# Patient Record
Sex: Male | Born: 1990 | Race: Black or African American | Hispanic: No | Marital: Single | Smoking: Current every day smoker
Health system: Southern US, Community
[De-identification: ages and names within clinical notes are randomized; demographics above are authoritative.]

---

## 2016-03-17 ENCOUNTER — Encounter (HOSPITAL_COMMUNITY): Payer: Self-pay

## 2016-03-17 ENCOUNTER — Emergency Department (HOSPITAL_COMMUNITY)
Admission: EM | Admit: 2016-03-17 | Discharge: 2016-03-17 | Disposition: A | Discharge status: Home / Self Care | Payer: Self-pay | Attending: Emergency Medicine | Admitting: Emergency Medicine

## 2016-03-17 DIAGNOSIS — F17200 Nicotine dependence, unspecified, uncomplicated: Secondary | ICD-10-CM | POA: Insufficient documentation

## 2016-03-17 DIAGNOSIS — Z711 Person with feared health complaint in whom no diagnosis is made: Secondary | ICD-10-CM

## 2016-03-17 DIAGNOSIS — R369 Urethral discharge, unspecified: Secondary | ICD-10-CM

## 2016-03-17 LAB — URINALYSIS, ROUTINE W REFLEX MICROSCOPIC
Bilirubin Urine: NEGATIVE
Glucose, UA: NEGATIVE mg/dL
Hgb urine dipstick: NEGATIVE
KETONES UR: NEGATIVE mg/dL
LEUKOCYTES UA: NEGATIVE
NITRITE: NEGATIVE
PH: 6 (ref 5.0–8.0)
PROTEIN: NEGATIVE mg/dL
Specific Gravity, Urine: 1.015 (ref 1.005–1.030)

## 2016-03-17 MED ORDER — CEFTRIAXONE SODIUM 250 MG IJ SOLR
250.0000 mg | Freq: Once | INTRAMUSCULAR | Status: AC
  Administered 2016-03-17: 250 mg via INTRAMUSCULAR
  Filled 2016-03-17: qty 250

## 2016-03-17 MED ORDER — LIDOCAINE HCL (PF) 1 % IJ SOLN
INTRAMUSCULAR | Status: AC
  Filled 2016-03-17: qty 5

## 2016-03-17 MED ORDER — AZITHROMYCIN 250 MG PO TABS
1000.0000 mg | ORAL_TABLET | Freq: Once | ORAL | Status: AC
  Administered 2016-03-17: 1000 mg via ORAL
  Filled 2016-03-17: qty 4

## 2016-03-17 NOTE — ED Provider Notes (Signed)
CSN: [id number]     Arrival date & time 03/17/16  1434 History   First MD Initiated Contact with Patient 03/17/16 1545     Chief Complaint  Patient presents with  . S74.5    Eddie Perez is a 25 y.o. male who presents to the ED Complaining of penile discharge for the past 2 weeks. Patient reports he is sexually active and has not been using protection. He reports a history of chlamydia. No known STD contacts recently. Patient denies fevers, abdominal pain, nausea, vomiting, diarrhea, dysuria, hematuria, testicular pain, penile pain, GU rashes, or mouth sores.   The history is provided by the patient. No language interpreter was used.    History reviewed. No pertinent past medical history. History reviewed. No pertinent past surgical history. No family history on file. Social History  Substance Use Topics  . Smoking status: Current Every Day Smoker  . Smokeless tobacco: None  . Alcohol Use: Yes     Comment: occasionally    Review of Systems  Constitutional: Negative for fever and chills.  Gastrointestinal: Negative for nausea, vomiting and abdominal pain.  Genitourinary: Positive for discharge. Negative for dysuria, urgency, frequency, hematuria, decreased urine volume, penile swelling, scrotal swelling, difficulty urinating, genital sores, penile pain and testicular pain.  Musculoskeletal: Negative for myalgias.  Skin: Negative for rash.      Allergies  Review of patient's allergies indicates no known allergies.  Home Medications   Prior to Admission medications   Not on File   BP 122/76 mmHg  Pulse 70  Temp(Src) 98 F (36.7 C) (Oral)  Resp 14  Ht  (1.626 m)  Wt 54.432 kg  BMI 20.59 kg/m2  SpO2 100% Physical Exam  Constitutional: He appears well-developed and well-nourished. No distress.  Nontoxic appearing.  HENT:  Head: Normocephalic and atraumatic.  Mouth/Throat: Oropharynx is clear and moist.  Eyes: Conjunctivae are normal. Pupils are equal, round,  and reactive to light. Right eye exhibits no discharge. Left eye exhibits no discharge.  Neck: Neck supple.  Cardiovascular: Normal rate, regular rhythm, normal heart sounds and intact distal pulses.   Pulmonary/Chest: Effort normal and breath sounds normal. No respiratory distress.  Abdominal: Soft. Bowel sounds are normal. He exhibits no distension. There is no tenderness. There is no guarding.  Abdomen is soft and nontender to palpation.  Genitourinary: Penis normal. No penile tenderness.  GU exam performed by me with male RN chaperone. No GU rashes noted. No testicular or penile tenderness to palpation. Scrotal contents is normal. Meatus is open. No penile discharge noted.  Lymphadenopathy:    He has no cervical adenopathy.  Neurological: He is alert. Coordination normal.  Skin: Skin is warm and dry. No rash noted. He is not diaphoretic. No erythema. No pallor.  Psychiatric: He has a normal mood and affect. His behavior is normal.  Nursing note and vitals reviewed.   ED Course  Procedures (including critical care time) Labs Review Labs Reviewed  URINALYSIS, ROUTINE W REFLEX MICROSCOPIC (NOT AT [facility])  GC/CHLAMYDIA PROBE AMP ([place]) NOT AT [facility]    Imaging Review No results found. I have personally reviewed and evaluated these  lab results as part of my medical decision-making.   EKG Interpretation None     Filed Vitals:   03/17/16 1438  BP: 122/76  Pulse: 70  Temp: 98 F (36.7 C)  TempSrc: Oral  Resp: 14  Height:  (1.626 m)  Weight: 54.432 kg  SpO2: 100%    MDM  Meds given in ED:  Medications  cefTRIAXone (ROCEPHIN) injection 250 mg (not administered)  lidocaine (PF) (XYLOCAINE) 1 % injection (not administered)  azithromycin (ZITHROMAX) tablet 1,000 mg (1,000 mg Oral Given 03/17/16 1603)    New Prescriptions   No medications on file    Final diagnoses:  Concern about STD in male without diagnosis  Penile discharge   This is a 25 y.o. male who  presents to the ED Complaining of penile discharge for the past 2 weeks. Patient reports he is sexually active and has not been using protection. He reports a history of chlamydia.  On exam the patient is afebrile nontoxic appearing. She exam is unremarkable. No penile discharge. No GU rashes. Urinalysis is within normal limits. We'll treat the patient for gonorrhea and chlamydia with Rocephin and azithromycin. I advised that done reacclimated testing are pending. She had extensive discussion about safe sex practices. I advised he is to [facility] for STD check for next week. I discussed return precautions. I advised the patient to follow-up with their primary care provider this week. I advised the patient to return to the emergency department with new or worsening symptoms or new concerns. The patient verbalized understanding and agreement with plan.       Everlene FarrierWilliam Zaylia Riolo, PA-C 03/17/16 1605  Glynn OctaveStephen Rancour, MD 03/17/16 [phone]

## 2016-03-17 NOTE — Discharge Instructions (Signed)
Sexually Transmitted Disease °A sexually transmitted disease (STD) is a disease or infection that may be passed (transmitted) from person to person, usually during sexual activity. This may happen by way of saliva, semen, blood, vaginal mucus, or urine. Common STDs include: °· Gonorrhea. °· Chlamydia. °· Syphilis. °· HIV and AIDS. °· Genital herpes. °· Hepatitis B and C. °· Trichomonas. °· Human papillomavirus (HPV). °· Pubic lice. °· Scabies. °· Mites. °· Bacterial vaginosis. °WHAT ARE CAUSES OF STDs? °An STD may be caused by bacteria, a virus, or parasites. STDs are often transmitted during sexual activity if one person is infected. However, they may also be transmitted through nonsexual means. STDs may be transmitted after:  °· Sexual intercourse with an infected person. °· Sharing sex toys with an infected person. °· Sharing needles with an infected person or using unclean piercing or tattoo needles. °· Having intimate contact with the genitals, mouth, or rectal areas of an infected person. °· Exposure to infected fluids during birth. °WHAT ARE THE SIGNS AND SYMPTOMS OF STDs? °Different STDs have different symptoms. Some people may not have any symptoms. If symptoms are present, they may include: °· Painful or bloody urination. °· Pain in the pelvis, abdomen, vagina, anus, throat, or eyes. °· A skin rash, itching, or irritation. °· Growths, ulcerations, blisters, or sores in the genital and anal areas. °· Abnormal vaginal discharge with or without bad odor. °· Penile discharge in men. °· Fever. °· Pain or bleeding during sexual intercourse. °· Swollen glands in the groin area. °· Yellow skin and eyes (jaundice). This is seen with hepatitis. °· Swollen testicles. °· Infertility. °· Sores and blisters in the mouth. °HOW ARE STDs DIAGNOSED? °To make a diagnosis, your health care provider may: °· Take a medical history. °· Perform a physical exam. °· Take a sample of any discharge to examine. °· Swab the throat,  cervix, opening to the penis, rectum, or vagina for testing. °· Test a sample of your first morning urine. °· Perform blood tests. °· Perform a Pap test, if this applies. °· Perform a colposcopy. °· Perform a laparoscopy. °HOW ARE STDs TREATED? °Treatment depends on the STD. Some STDs may be treated but not cured. °· Chlamydia, gonorrhea, trichomonas, and syphilis can be cured with antibiotic medicine. °· Genital herpes, hepatitis, and HIV can be treated, but not cured, with prescribed medicines. The medicines lessen symptoms. °· Genital warts from HPV can be treated with medicine or by freezing, burning (electrocautery), or surgery. Warts may come back. °· HPV cannot be cured with medicine or surgery. However, abnormal areas may be removed from the cervix, vagina, or vulva. °· If your diagnosis is confirmed, your recent sexual partners need treatment. This is true even if they are symptom-free or have a negative culture or evaluation. They should not have sex until their health care providers say it is okay. °· Your health care provider may test you for infection again 3 months after treatment. °HOW CAN I REDUCE MY RISK OF GETTING AN STD? °Take these steps to reduce your risk of getting an STD: °· Use latex condoms, dental dams, and water-soluble lubricants during sexual activity. Do not use petroleum jelly or oils. °· Avoid having multiple sex partners. °· Do not have sex with someone who has other sex partners °· Do not have sex with anyone you do not know or who is at high risk for an STD. °· Avoid risky sex practices that can break your skin. °· Do not have sex   if you have open sores on your mouth or skin. °· Avoid drinking too much alcohol or taking illegal drugs. Alcohol and drugs can affect your judgment and put you in a vulnerable position. °· Avoid engaging in oral and anal sex acts. °· Get vaccinated for HPV and hepatitis. If you have not received these vaccines in the past, talk to your health care  provider about whether one or both might be right for you. °· If you are at risk of being infected with HIV, it is recommended that you take a prescription medicine daily to prevent HIV infection. This is called pre-exposure prophylaxis (PrEP). You are considered at risk if: °¨ You are a man who has sex with other men (MSM). °¨ You are a heterosexual man or woman and are sexually active with more than one partner. °¨ You take drugs by injection. °¨ You are sexually active with a partner who has HIV. °· Talk with your health care provider about whether you are at high risk of being infected with HIV. If you choose to begin PrEP, you should first be tested for HIV. You should then be tested every 3 months for as long as you are taking PrEP. °WHAT SHOULD I DO IF I THINK I HAVE AN STD? °· See your health care provider. °· Tell your sexual partner(s). They should be tested and treated for any STDs. °· Do not have sex until your health care provider says it is okay. °WHEN SHOULD I GET IMMEDIATE MEDICAL CARE? °Contact your health care provider right away if:  °· You have severe abdominal pain. °· You are a man and notice swelling or pain in your testicles. °· You are a woman and notice swelling or pain in your vagina. °  °This information is not intended to replace advice given to you by your health care provider. Make sure you discuss any questions you have with your health care provider. °  °Document Released: 12/17/2002 Document Revised: 10/17/2014 Document Reviewed: 04/16/2013 °Elsevier Interactive Patient Education ©2016 [employer] ° °

## 2016-03-17 NOTE — ED Notes (Signed)
Pt reports penile discharge and "weird feeling down there" for the past week.

## 2016-03-18 LAB — GC/CHLAMYDIA PROBE AMP (~~LOC~~) NOT AT ARMC
CHLAMYDIA, DNA PROBE: NEGATIVE
NEISSERIA GONORRHEA: POSITIVE — AB

## 2016-03-21 ENCOUNTER — Telehealth (HOSPITAL_BASED_OUTPATIENT_CLINIC_OR_DEPARTMENT_OTHER): Payer: Self-pay | Admitting: Emergency Medicine

## 2017-08-23 ENCOUNTER — Emergency Department (HOSPITAL_COMMUNITY): Payer: Self-pay

## 2017-08-23 ENCOUNTER — Emergency Department (HOSPITAL_COMMUNITY)
Admission: EM | Admit: 2017-08-23 | Discharge: 2017-08-23 | Disposition: A | Discharge status: Home / Self Care | Payer: Self-pay | Attending: Emergency Medicine | Admitting: Emergency Medicine

## 2017-08-23 DIAGNOSIS — S46819A Strain of other muscles, fascia and tendons at shoulder and upper arm level, unspecified arm, initial encounter: Secondary | ICD-10-CM

## 2017-08-23 DIAGNOSIS — S46812A Strain of other muscles, fascia and tendons at shoulder and upper arm level, left arm, initial encounter: Secondary | ICD-10-CM | POA: Insufficient documentation

## 2017-08-23 DIAGNOSIS — Y939 Activity, unspecified: Secondary | ICD-10-CM | POA: Insufficient documentation

## 2017-08-23 DIAGNOSIS — Y929 Unspecified place or not applicable: Secondary | ICD-10-CM | POA: Insufficient documentation

## 2017-08-23 DIAGNOSIS — F17200 Nicotine dependence, unspecified, uncomplicated: Secondary | ICD-10-CM | POA: Insufficient documentation

## 2017-08-23 DIAGNOSIS — Y999 Unspecified external cause status: Secondary | ICD-10-CM | POA: Insufficient documentation

## 2017-08-23 MED ORDER — METHOCARBAMOL 500 MG PO TABS
1000.0000 mg | ORAL_TABLET | Freq: Once | ORAL | Status: AC
  Administered 2017-08-23: 1000 mg via ORAL
  Filled 2017-08-23: qty 2

## 2017-08-23 MED ORDER — IBUPROFEN 200 MG PO TABS
600.0000 mg | ORAL_TABLET | Freq: Once | ORAL | Status: AC
  Administered 2017-08-23: 600 mg via ORAL
  Filled 2017-08-23: qty 1

## 2017-08-23 MED ORDER — IBUPROFEN 600 MG PO TABS: 600.0000 mg | ORAL_TABLET | Freq: Four times a day (QID) | ORAL | 0 refills | Status: AC | PRN

## 2017-08-23 MED ORDER — CYCLOBENZAPRINE HCL 10 MG PO TABS: 10.0000 mg | ORAL_TABLET | Freq: Three times a day (TID) | ORAL | 0 refills | Status: AC | PRN

## 2017-08-23 NOTE — ED Notes (Signed)
Patient given discharge instructions and verbalized understanding.  Patient stable to discharge at this time.  Patient is alert and oriented to baseline.  No distressed noted at this time.  All belongings taken with the patient at discharge.   
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
